# Patient Record
Sex: Female | Born: 1940 | Race: White | Hispanic: Yes | Marital: Single
Health system: Southern US, Community
[De-identification: ages and names within clinical notes are randomized; demographics above are authoritative.]

## PROBLEM LIST (undated history)

## (undated) DIAGNOSIS — I10 Essential (primary) hypertension: Secondary | ICD-10-CM

---

## 2019-06-11 ENCOUNTER — Other Ambulatory Visit: Payer: Self-pay

## 2019-06-11 ENCOUNTER — Encounter (HOSPITAL_BASED_OUTPATIENT_CLINIC_OR_DEPARTMENT_OTHER): Payer: Self-pay | Admitting: *Deleted

## 2019-06-11 ENCOUNTER — Emergency Department (HOSPITAL_BASED_OUTPATIENT_CLINIC_OR_DEPARTMENT_OTHER)
Admission: EM | Admit: 2019-06-11 | Discharge: 2019-06-12 | Disposition: A | Discharge status: Home / Self Care | Payer: Self-pay | Attending: Emergency Medicine | Admitting: Emergency Medicine

## 2019-06-11 ENCOUNTER — Emergency Department (HOSPITAL_BASED_OUTPATIENT_CLINIC_OR_DEPARTMENT_OTHER): Payer: Self-pay

## 2019-06-11 DIAGNOSIS — Z7982 Long term (current) use of aspirin: Secondary | ICD-10-CM | POA: Insufficient documentation

## 2019-06-11 DIAGNOSIS — R112 Nausea with vomiting, unspecified: Secondary | ICD-10-CM | POA: Insufficient documentation

## 2019-06-11 DIAGNOSIS — I10 Essential (primary) hypertension: Secondary | ICD-10-CM | POA: Insufficient documentation

## 2019-06-11 DIAGNOSIS — Z79899 Other long term (current) drug therapy: Secondary | ICD-10-CM | POA: Insufficient documentation

## 2019-06-11 DIAGNOSIS — F1310 Sedative, hypnotic or anxiolytic abuse, uncomplicated: Secondary | ICD-10-CM | POA: Insufficient documentation

## 2019-06-11 DIAGNOSIS — R42 Dizziness and giddiness: Secondary | ICD-10-CM | POA: Insufficient documentation

## 2019-06-11 HISTORY — DX: Essential (primary) hypertension: I10

## 2019-06-11 LAB — DIFFERENTIAL
Abs Immature Granulocytes: 0.04 10*3/uL (ref 0.00–0.07)
Basophils Absolute: 0 10*3/uL (ref 0.0–0.1)
Basophils Relative: 0 %
Eosinophils Absolute: 0 10*3/uL (ref 0.0–0.5)
Eosinophils Relative: 0 %
Immature Granulocytes: 1 %
Lymphocytes Relative: 18 %
Lymphs Abs: 1.4 10*3/uL (ref 0.7–4.0)
Monocytes Absolute: 0.2 10*3/uL (ref 0.1–1.0)
Monocytes Relative: 2 %
Neutro Abs: 5.8 10*3/uL (ref 1.7–7.7)
Neutrophils Relative %: 79 %

## 2019-06-11 LAB — COMPREHENSIVE METABOLIC PANEL
ALT: 22 U/L (ref 0–44)
AST: 21 U/L (ref 15–41)
Albumin: 4.2 g/dL (ref 3.5–5.0)
Alkaline Phosphatase: 80 U/L (ref 38–126)
Anion gap: 12 (ref 5–15)
BUN: 17 mg/dL (ref 8–23)
CO2: 24 mmol/L (ref 22–32)
Calcium: 8.7 mg/dL — ABNORMAL LOW (ref 8.9–10.3)
Chloride: 101 mmol/L (ref 98–111)
Creatinine, Ser: 0.79 mg/dL (ref 0.44–1.00)
GFR calc Af Amer: >60 mL/min (ref >60)
GFR calc non Af Amer: >60 mL/min (ref >60)
Glucose, Bld: 164 mg/dL — ABNORMAL HIGH (ref 70–99)
Potassium: 3.7 mmol/L (ref 3.5–5.1)
Sodium: 137 mmol/L (ref 135–145)
Total Bilirubin: 0.7 mg/dL (ref 0.3–1.2)
Total Protein: 7.6 g/dL (ref 6.5–8.1)

## 2019-06-11 LAB — URINALYSIS, ROUTINE W REFLEX MICROSCOPIC
Bilirubin Urine: NEGATIVE
Glucose, UA: NEGATIVE mg/dL
Ketones, ur: 15 mg/dL — AB
Leukocytes,Ua: NEGATIVE
Nitrite: NEGATIVE
Protein, ur: 30 mg/dL — AB
Specific Gravity, Urine: 1.02 (ref 1.005–1.030)
pH: 8 (ref 5.0–8.0)

## 2019-06-11 LAB — URINALYSIS, MICROSCOPIC (REFLEX): WBC, UA: NONE SEEN WBC/hpf (ref 0–5)

## 2019-06-11 LAB — CBC
HCT: 40.3 % (ref 36.0–46.0)
Hemoglobin: 13.5 g/dL (ref 12.0–15.0)
MCH: 30.5 pg (ref 26.0–34.0)
MCHC: 33.5 g/dL (ref 30.0–36.0)
MCV: 91 fL (ref 80.0–100.0)
Platelets: 221 10*3/uL (ref 150–400)
RBC: 4.43 MIL/uL (ref 3.87–5.11)
RDW: 12.1 % (ref 11.5–15.5)
WBC: 7.5 10*3/uL (ref 4.0–10.5)
nRBC: 0 % (ref 0.0–0.2)

## 2019-06-11 LAB — PROTIME-INR
INR: 1 (ref 0.8–1.2)
Prothrombin Time: 12.7 seconds (ref 11.4–15.2)

## 2019-06-11 LAB — APTT: aPTT: 31 seconds (ref 24–36)

## 2019-06-11 LAB — ETHANOL: Alcohol, Ethyl (B): <10 mg/dL (ref <10)

## 2019-06-11 MED ORDER — MECLIZINE HCL 25 MG PO TABS
25.0000 mg | ORAL_TABLET | Freq: Once | ORAL | Status: AC
  Administered 2019-06-11: 25 mg via ORAL
  Filled 2019-06-11: qty 1

## 2019-06-11 NOTE — ED Provider Notes (Signed)
[facility] EMERGENCY DEPARTMENT Provider Note   CSN: [id number] Arrival date & time: 06/11/19  2149     History Chief Complaint  Patient presents with  . Dizziness    Kylie Browning is a 79 y.o. female.  The history is provided by the patient and a relative. The history is limited by a language barrier. A language interpreter was used.  Dizziness Quality:  Room spinning and imbalance Severity:  Severe Onset quality:  Gradual Duration:  6 hours Timing:  Constant Progression:  Resolved Chronicity:  New Context: not with loss of consciousness   Context comment:  Just started today around 3pm but wasn't doing anything unusual Relieved by:  Being still Worsened by:  Movement, standing up and turning head (walking) Ineffective treatments:  None tried Associated symptoms: nausea and vomiting   Associated symptoms: no chest pain, no diarrhea, no headaches, no hearing loss, no palpitations, no shortness of breath, no syncope, no tinnitus, no vision changes and no weakness   Associated symptoms comment:  When this happened and after she vomited she was very upset and they checked her blood pressure and was elevated at 220/110.  Pt took an additional dose of her BP med olmesartan and amlodipine.  Pt currently reports she feels better. Risk factors: no heart disease, no hx of stroke and no hx of vertigo   Risk factors comment:  Hx of HTN.  been on same meds for long period of time and has not missed doses      Past Medical History:  Diagnosis Date  . Hypertension     There are no problems to display for this patient.      OB History   No obstetric history on file.     No family history on file.  Social History   Tobacco Use  . Smoking status: Not on file  Substance Use Topics  . Alcohol use: Not on file  . Drug use: Not on file    Home Medications Prior to Admission medications   Medication Sig Start Date End Date Taking? Authorizing Provider   amLODipine (NORVASC) 5 MG tablet Take 5 mg by mouth daily.   Yes [provider]  aspirin 81 MG chewable tablet Chew by mouth daily.   Yes [provider]  olmesartan (BENICAR) 40 MG tablet Take 40 mg by mouth daily.   Yes [provider]    Allergies    Patient has no known allergies.  Review of Systems   Review of Systems  HENT: Negative for hearing loss and tinnitus.   Respiratory: Negative for shortness of breath.   Cardiovascular: Negative for chest pain, palpitations and syncope.  Gastrointestinal: Positive for nausea and vomiting. Negative for diarrhea.  Neurological: Positive for dizziness. Negative for weakness and headaches.  All other systems reviewed and are negative.   Physical Exam Updated Vital Signs BP (!) 189/92   Pulse 87   Temp 98.6 F (37 C) (Oral)   Resp 14   Ht 5\' 2"  (1.575 m)   Wt 64.2 kg   SpO2 98%   BMI 25.88 kg/m   Physical Exam Vitals and nursing note reviewed.  Constitutional:      General: She is not in acute distress.    Appearance: Normal appearance. She is well-developed and normal weight.  HENT:     Head: Normocephalic and atraumatic.     Mouth/Throat:     Mouth: Mucous membranes are moist.  Eyes:     General:  No visual field deficit.    Extraocular Movements: Extraocular movements intact.     Pupils: Pupils are equal, round, and reactive to light.  Neck:     Vascular: No carotid bruit.  Cardiovascular:     Rate and Rhythm: Normal rate and regular rhythm.     Heart sounds: Normal heart sounds. No murmur. No friction rub.  Pulmonary:     Effort: Pulmonary effort is normal.     Breath sounds: Normal breath sounds. No wheezing or rales.  Abdominal:     General: Bowel sounds are normal. There is no distension.     Palpations: Abdomen is soft.     Tenderness: There is no abdominal tenderness. There is no guarding or rebound.  Musculoskeletal:        General: No tenderness. Normal range of motion.      Cervical back: Normal range of motion and neck supple.     Comments: No edema  Skin:    General: Skin is warm and dry.     Findings: No rash.  Neurological:     Mental Status: She is alert and oriented to person, place, and time.     Cranial Nerves: No cranial nerve deficit, dysarthria or facial asymmetry.     Sensory: Sensation is intact.     Motor: Motor function is intact. No pronator drift.     Coordination: Coordination is intact. Coordination normal. Finger-Nose-Finger Test and Heel to [facility] Test normal.     Gait: Gait is intact.     Comments: When up and walking pt reports she now feels normal and has no dizziness.  No ataxia noted.  Psychiatric:        Behavior: Behavior normal.     ED Results / Procedures / Treatments   Labs (all labs ordered are listed, but only abnormal results are displayed) Labs Reviewed  ETHANOL  RAPID URINE DRUG SCREEN, HOSP PERFORMED  URINALYSIS, ROUTINE W REFLEX MICROSCOPIC  PROTIME-INR  APTT  CBC  DIFFERENTIAL  COMPREHENSIVE METABOLIC PANEL    EKG EKG Interpretation  Date/Time:  Monday Jun 11 2019 22:00:19 EDT Ventricular Rate:  95 PR Interval:    QRS Duration: 111 QT Interval:  402 QTC Calculation: 506 R Axis:   64 Text Interpretation: Sinus rhythm Consider right atrial enlargement Nonspecific repol abnormality, lateral leads Prolonged QT interval No previous tracing Confirmed by Blanchie Dessert [phone]) on 06/11/2019 10:42:19 PM   Radiology No results found.  Procedures Procedures (including critical care time)  Medications Ordered in ED Medications  meclizine (ANTIVERT) tablet 25 mg (has no administration in time range)    ED Course  I have reviewed the triage vital signs and the nursing notes.  Pertinent labs & imaging results that were available during my care of the patient were reviewed by me and considered in my medical decision making (see chart for details).    MDM Rules/Calculators/A&P                       79 year old female presenting today with sudden onset of vertigo that started around 3 PM.  It worsened after she took a nap and was significant when she attempted to walk or stand.  She states it felt like the ceiling was coming down on her.  Everything was spinning.  She had one episode of vomiting related with the dizziness and family member took her blood pressure and it was significantly elevated at 220/110.  They gave her an additional dose  of her blood pressure medication.  Patient continued to feel dizzy so they brought her to the emergency room.  She had no other associated symptoms such as facial droop, slurred speech, unilateral weakness or numbness.  She has no headache, neck pain, chest pain or shortness of breath.  Low suspicion for vertebral or carotid dissection.  Patient's neuro examination is within normal limits.  She has normal gait without dizziness at this time, normal sensation, strength and extraocular movements without nystagmus.  Suspect patient's symptoms are from peripheral vertigo given history and exam.  Hypertension is concerning but could be the result of her vomiting and being upset.  We will continue to follow.  Head CT, labs are pending.  EKG within normal limits.  Patient given meclizine but reports her symptoms are mostly resolved.  Final Clinical Impression(s) / ED Diagnoses Final diagnoses:  None    Rx / DC Orders ED Discharge Orders    None       Gwyneth Sprout, MD 06/11/19 2336

## 2019-06-11 NOTE — ED Triage Notes (Addendum)
Pt c.o sudden onset of dizziness x 7 hrs , vomiting x 3 hrs ago , increased BP, pt took another dose of her BP med

## 2019-06-11 NOTE — ED Notes (Signed)
ED Provider at bedside. 

## 2019-06-11 NOTE — ED Notes (Signed)
Patient able to ambulate without difficulty in room. Patient denies dizziness.

## 2019-06-12 LAB — RAPID URINE DRUG SCREEN, HOSP PERFORMED
Amphetamines: NOT DETECTED
Barbiturates: POSITIVE — AB
Benzodiazepines: NOT DETECTED
Cocaine: NOT DETECTED
Opiates: NOT DETECTED
Tetrahydrocannabinol: NOT DETECTED

## 2019-06-12 MED ORDER — MECLIZINE HCL 12.5 MG PO TABS: 12.5000 mg | ORAL_TABLET | Freq: Three times a day (TID) | ORAL | 0 refills | Status: AC | PRN

## 2019-06-12 NOTE — ED Provider Notes (Signed)
Patient signed out to me by Dr. Revonda Humphrey to follow-up on labs.  Patient was seen with vertigo symptoms earlier.  She was hypertensive at arrival which has resolved here in the ER.  CT scan did not show any acute abnormality and her labs are all within normal limits.  Upon recheck patient reports that she feels very good.  She no longer has any dizziness and is back to her normal baseline.  Based on her work-up and resolution of symptoms, it is reasonable to discharge her, continue treatment for likely peripheral vertigo and have follow-up as needed.   Gilda Crease, MD 06/12/19 [phone]

## 2021-05-03 IMAGING — CT CT HEAD W/O CM
3 series · 15 of 47 positions shown, 18 images · non-contrast
Comparison: None.

CLINICAL DATA: Sudden onset dizziness, vomiting, hypertension

EXAM:
CT HEAD WITHOUT CONTRAST
TECHNIQUE: Contiguous axial images were obtained from the base of the skull
through the vertex without intravenous contrast.

[Series 2: head wo · axial · 0.39mm/px · z∈[-263,-138]mm · 9 of 30 slices shown, 12 images]
[im 3/30  brain]
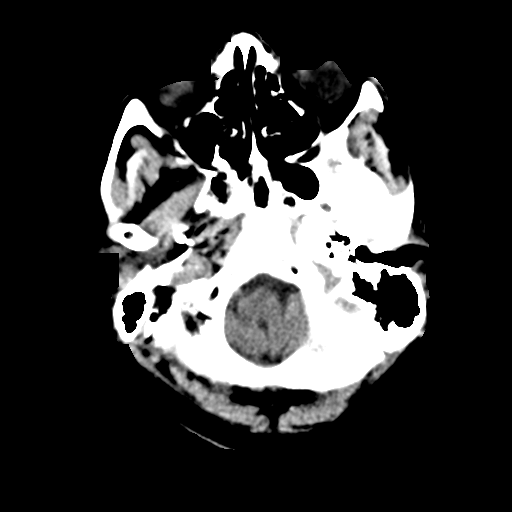
[im 3/30  bone]
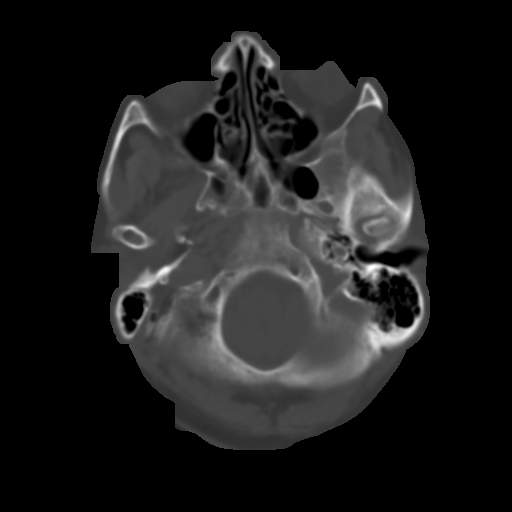
[im 6/30  brain]
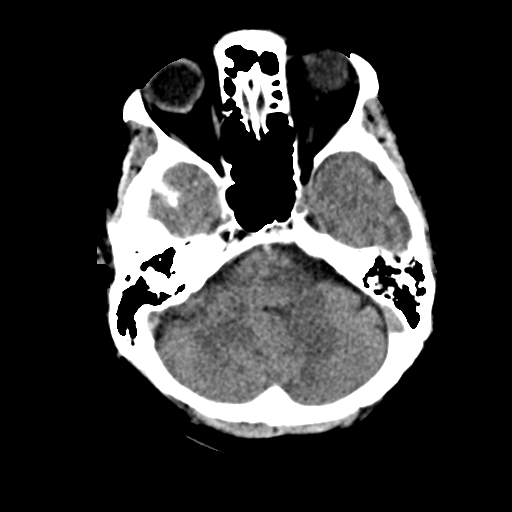
[im 9/30  brain]
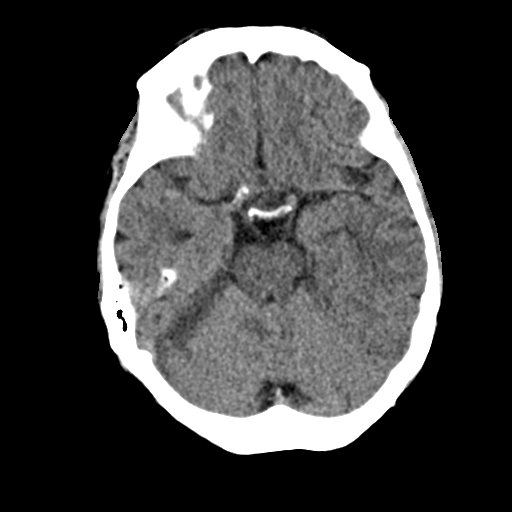
[im 12/30  brain]
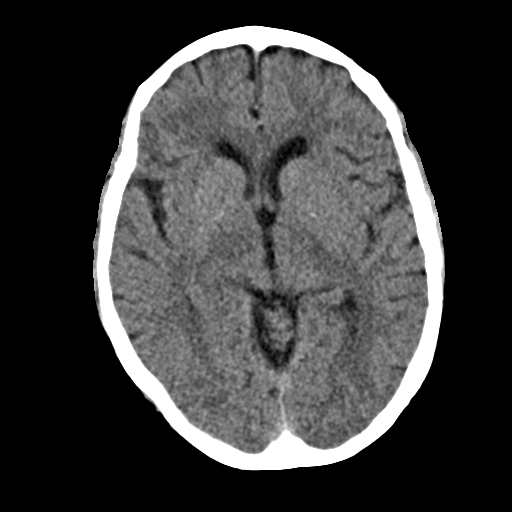
[im 16/30  brain]
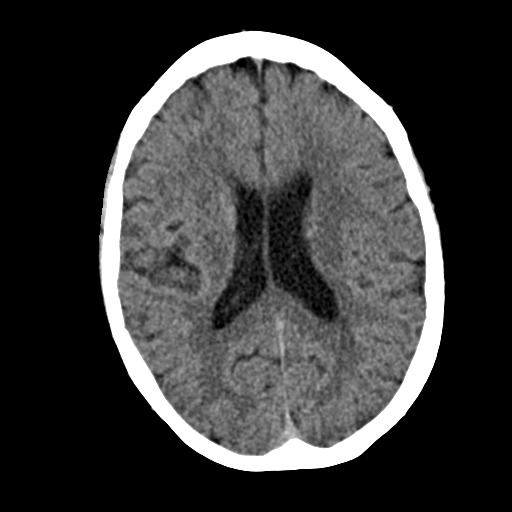
[im 16/30  bone]
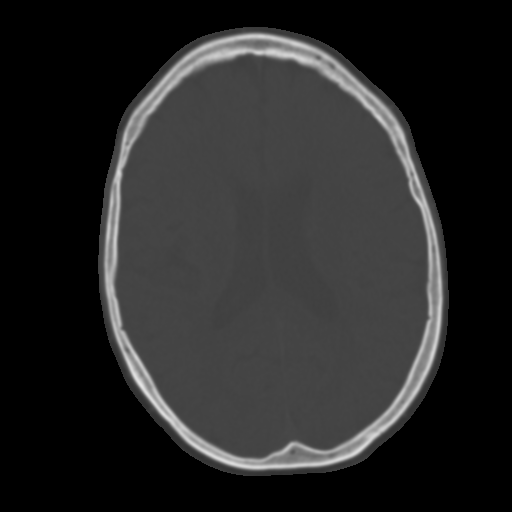
[im 19/30  brain]
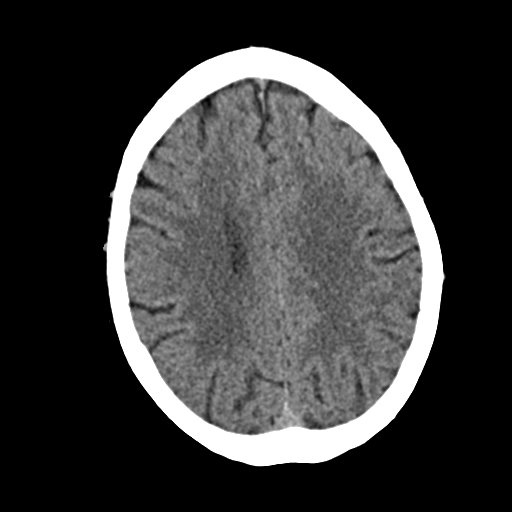
[im 22/30  brain]
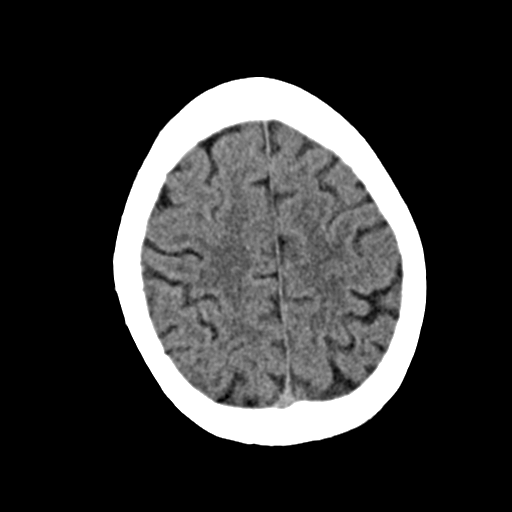
[im 25/30  brain]
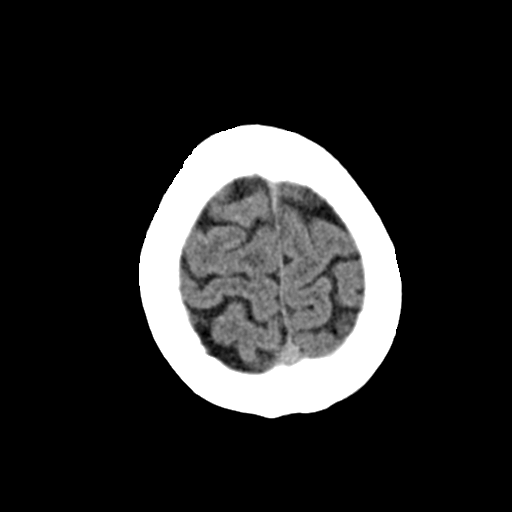
[im 28/30  brain]
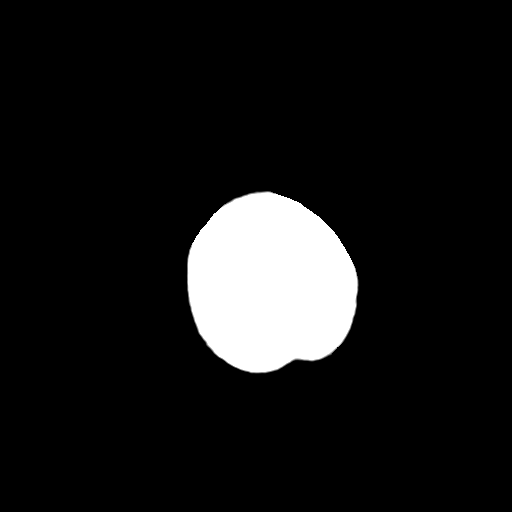
[im 28/30  bone]
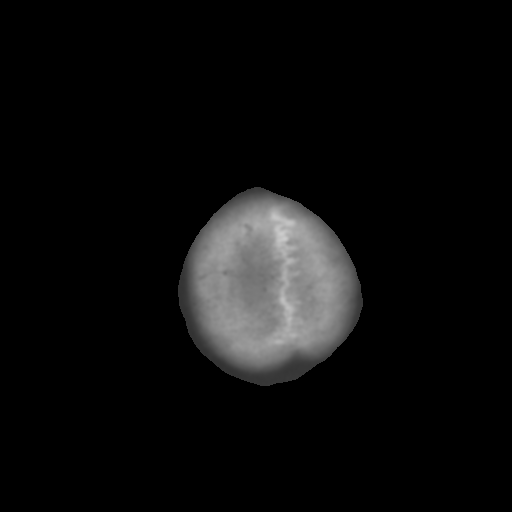

[Series 4: coronal soft · coronal · 0.31mm/px · 3 of 71 slices shown]
[im 24/71  brain]
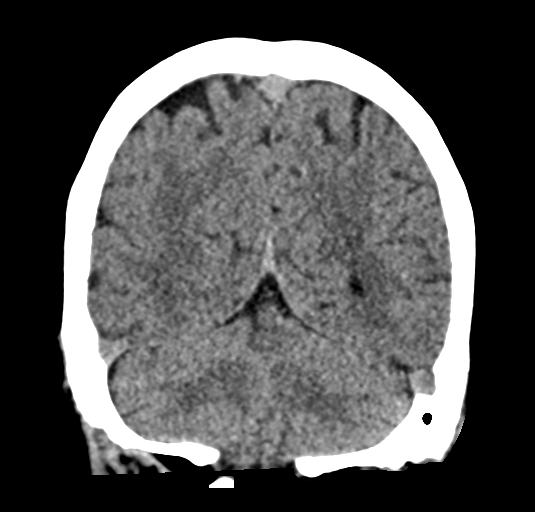
[im 32/71  brain]
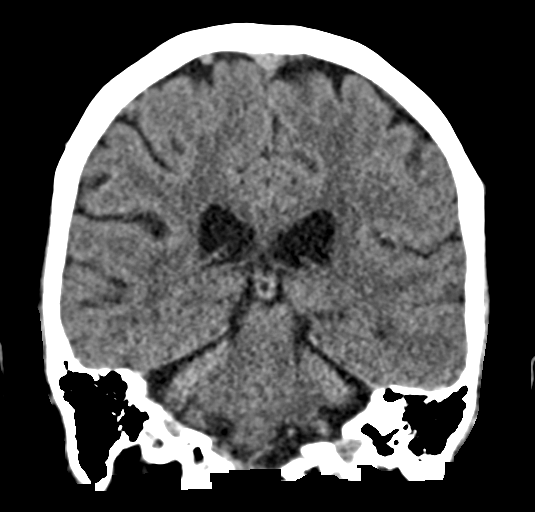
[im 39/71  brain]
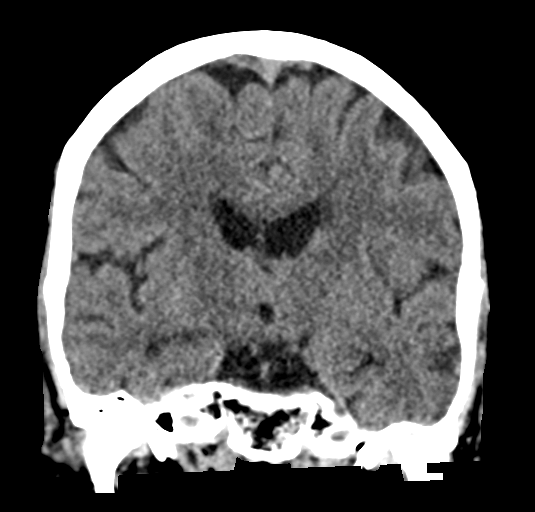

[Series 5: sag soft · sagittal · 0.29mm/px · 3 of 59 slices shown]
[im 20/59  brain]
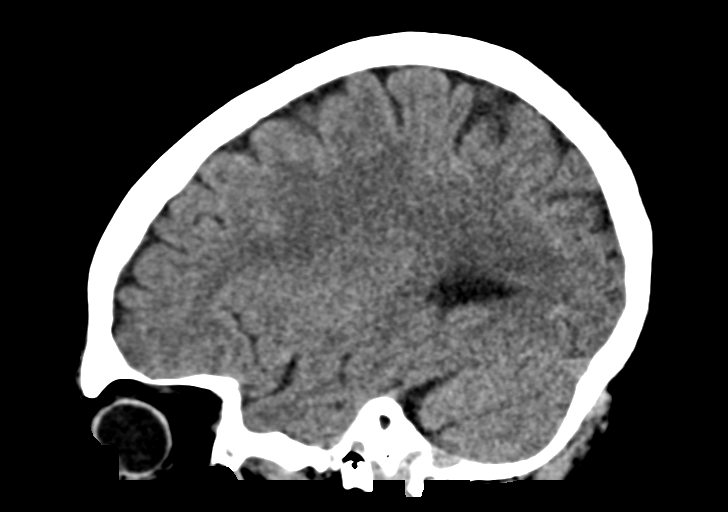
[im 30/59  brain]
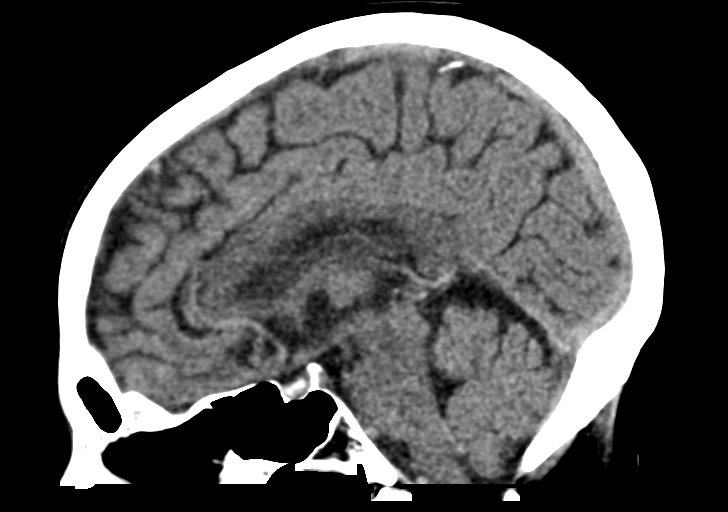
[im 39/59  brain]
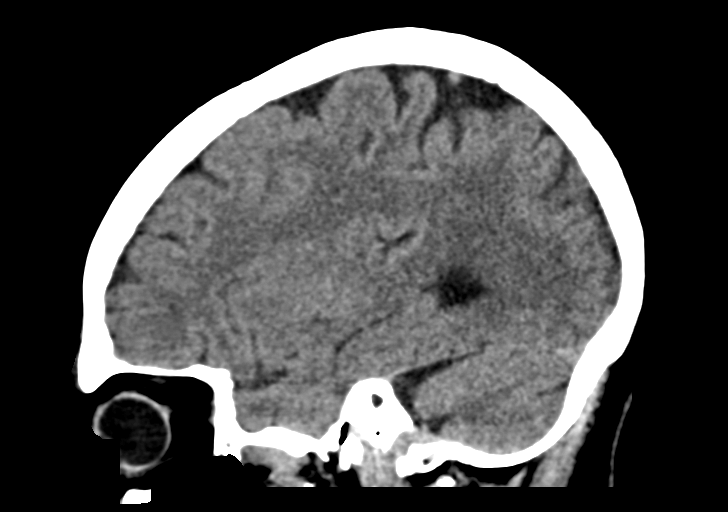

[15 of 47 positions shown; findings below may reference images not displayed]

FINDINGS: Brain: No acute infarct or hemorrhage. Lateral ventricles and
midline structures are unremarkable. No acute extra-axial fluid
collections. No mass effect.

Vascular: No hyperdense vessel or unexpected calcification.

Skull: Normal. Negative for fracture or focal lesion.

Sinuses/Orbits: No acute finding.

Other: None.
IMPRESSION: 1. No acute intracranial process.
